# Patient Record
Sex: Female | Born: 1935 | Race: White | Hispanic: No
Health system: Midwestern US, Academic
[De-identification: ages and names within clinical notes are randomized; demographics above are authoritative.]

---

## 2020-02-28 ENCOUNTER — Encounter: Admit: 2020-02-28 | Discharge: 2020-02-28

## 2020-02-29 ENCOUNTER — Encounter: Admit: 2020-02-29 | Discharge: 2020-02-29

## 2020-03-22 ENCOUNTER — Ambulatory Visit: Admit: 2020-03-22 | Discharge: 2020-03-22 | Payer: MEDICARE

## 2020-03-22 ENCOUNTER — Encounter: Admit: 2020-03-22 | Discharge: 2020-03-22 | Payer: MEDICARE

## 2020-03-22 DIAGNOSIS — I272 Pulmonary hypertension, unspecified: Secondary | ICD-10-CM

## 2020-03-22 DIAGNOSIS — I10 Essential (primary) hypertension: Secondary | ICD-10-CM

## 2020-03-22 DIAGNOSIS — I509 Heart failure, unspecified: Secondary | ICD-10-CM

## 2020-03-22 DIAGNOSIS — I714 Abdominal aortic aneurysm, without rupture: Secondary | ICD-10-CM

## 2020-03-22 DIAGNOSIS — E785 Hyperlipidemia, unspecified: Secondary | ICD-10-CM

## 2020-03-22 DIAGNOSIS — E039 Hypothyroidism, unspecified: Secondary | ICD-10-CM

## 2020-03-22 MED ORDER — ASPIRIN 81 MG PO TBEC
81 mg | ORAL_TABLET | Freq: Every day | ORAL | 0 refills | Status: AC
Start: 2020-03-22 — End: ?

## 2020-03-22 NOTE — Progress Notes
Date of Service: 03/22/2020              Chief Complaint   Patient presents with   ? Other     AAA       History of Present Illness  I had the pleasure seeing this 86 year old female in the office for evaluation of an asymptomatic pararenal abdominal aortic aneurysm.  This has been monitored for the last year and a half and measured approximately 4.2 cm according to the patient when it was discovered.  There was some concern of growth on the most recent CT scan and she was referred for evaluation.  She otherwise is fairly active for her age and enjoys yard work and gardening.  She has significant scoliosis and certainly has issues with spinal stenosis and neuropathy.  She has osteoarthritis of her knees bilaterally.  She did state that her mother had an aneurysm but she died from Parkinson's disease.  She never needed any repair.  Patient has a distant history of smoking which she quit many years ago but when she was a smoker she smoked 1/2 pack/day for approximately 20 years.           Medical History:   Diagnosis Date   ? AAA (abdominal aortic aneurysm) (HCC)    ? Benign essential HTN    ? CHF (congestive heart failure) (HCC)    ? HLD (hyperlipidemia)    ? Hypothyroidism    ? Pulmonary HTN (HCC)        Surgical History:   Procedure Laterality Date   ? HYSTERECTOMY         Allergies:  Allergies   Allergen Reactions   ? Propranolol UNKNOWN       Medication List:  ? amLODIPine (NORVASC) 5 mg tablet Take 5 mg by mouth daily.   ? atorvastatin calcium (ATORVASTATIN PO) Take  by mouth.   ? CALCIUM PO Take  by mouth.   ? docosahexaenoic acid/epa (FISH OIL PO) Take  by mouth.   ? ergocalciferol (vitamin D2) (VITAMIN D PO) Take  by mouth.   ? Ferrous Fumarate 324 mg (106 mg iron) tab Take  by mouth.   ? furosemide (LASIX) 20 mg tablet Take 20 mg by mouth every morning.   ? HYDROcodone/acetaminophen (NORCO) 7.5/325 mg tablet Take 1 tablet by mouth every 6 hours as needed   ? levothyroxine (SYNTHROID) 200 mcg tablet Take 200 mcg by mouth daily 30 minutes before breakfast.   ? losartan (COZAAR) 50 mg tablet Take 50 mg by mouth daily.   ? methotrexate sodium 2.5 mg tablet Take 2.5 mg by mouth every 7 days.   ? nadoloL (CORGARD) 40 mg tablet Take 40 mg by mouth daily.   ? omeprazole DR (PRILOSEC) 20 mg capsule Take 20 mg by mouth daily before breakfast.   ? simvastatin (ZOCOR) 20 mg tablet Take 20 mg by mouth at bedtime daily.   ? zolpidem (AMBIEN) 10 mg tablet Take 10 mg by mouth at bedtime as needed.       Social History:   reports that she has quit smoking. She has never used smokeless tobacco. She reports previous alcohol use. She reports that she does not use drugs.    History reviewed. No pertinent family history.    Review of Systems   Constitutional: Negative for activity change, appetite change, chills, diaphoresis, fatigue, fever and unexpected weight change.   HENT: Negative for hearing loss, nosebleeds and tinnitus.    Eyes: Negative for  photophobia, itching and visual disturbance.   Respiratory: Negative for apnea, cough, chest tightness and shortness of breath.    Cardiovascular: Negative for chest pain, palpitations and leg swelling.   Gastrointestinal: Negative for abdominal pain, blood in stool, constipation, diarrhea, nausea and vomiting.   Endocrine: Negative for cold intolerance and heat intolerance.   Genitourinary: Negative for dysuria, frequency and hematuria.   Musculoskeletal: Negative for arthralgias, back pain, gait problem, joint swelling and myalgias.   Skin: Negative for color change, rash and wound.   Allergic/Immunologic: Negative for environmental allergies, food allergies and immunocompromised state.   Neurological: Negative for dizziness, tremors, seizures, syncope, facial asymmetry, speech difficulty, weakness, light-headedness and headaches.   Hematological: Negative for adenopathy. Does not bruise/bleed easily.   Psychiatric/Behavioral: Negative for behavioral problems, confusion and dysphoric mood. The patient is not nervous/anxious.    All other systems reviewed and are negative.              Vitals:    03/22/20 1443   BP: (!) 158/81   BP Source: Arm, Left Upper   Patient Position: Sitting   Pulse: 56   Weight: 78.9 kg (174 lb)   Height: 175.3 cm (69)     Body mass index is 25.7 kg/m?Marland Kitchen     Physical Exam  Constitutional:       Appearance: She is well-developed. She is not diaphoretic.   HENT:      Head: Normocephalic and atraumatic.   Eyes:      General:         Left eye: No discharge.      Conjunctiva/sclera: Conjunctivae normal.      Pupils: Pupils are equal, round, and reactive to light.   Neck:      Thyroid: No thyromegaly.      Vascular: No carotid bruit or JVD.      Trachea: No tracheal deviation.   Cardiovascular:      Rate and Rhythm: Normal rate and regular rhythm.      Pulses:           Carotid pulses are 2+ on the right side and 2+ on the left side.       Radial pulses are 2+ on the right side and 2+ on the left side.        Femoral pulses are 2+ on the right side and 2+ on the left side.       Dorsalis pedis pulses are detected w/ Doppler on the right side and detected w/ Doppler on the left side.        Posterior tibial pulses are detected w/ Doppler on the right side and detected w/ Doppler on the left side.      Heart sounds: Normal heart sounds. No murmur heard.   No friction rub. No gallop.    Pulmonary:      Effort: Pulmonary effort is normal. No respiratory distress.      Breath sounds: Normal breath sounds. No wheezing or rales.   Abdominal:      General: Bowel sounds are normal. There is no distension.      Palpations: Abdomen is soft. There is pulsatile mass. There is no mass.      Tenderness: There is no abdominal tenderness.   Musculoskeletal:         General: No tenderness or deformity. Normal range of motion.      Cervical back: Normal range of motion and neck supple.   Skin:     General:  Skin is warm and dry.      Findings: No erythema.   Neurological:      Mental Status: She is alert and oriented to person, place, and time.      Cranial Nerves: No cranial nerve deficit.   Psychiatric:         Behavior: Behavior normal.             Assessment and Plan:    Asymptomatic pararenal abdominal aortic aneurysm--I did review the patient's most recent CT scan of the abdomen and pelvis which does show a pararenal abdominal aortic aneurysm extending up to the level of the superior mesenteric artery.  It is involving the ostium of the right and left renal artery.  The maximum dimension that I can measure is approximately 4 cm in a orthogonal measurement.  I am not markedly concerned that there has been an great change in size.  I have recommended a repeat aortic duplex in 6 months with reevaluation.  If it is unchanged at that time, we will go back to annual testing.  All questions were answered to the patient's and her grandson satisfaction today.    Carlena Sax, DO, FACS, RPVI  Assistant Professor of Vascular Surgery  [place] [facility]

## 2020-03-22 NOTE — Progress Notes
86 y.o. female     New patient: AAA     Medications updated?  Yes       ? amLODIPine (NORVASC) 5 mg tablet Take 5 mg by mouth daily.   ? CALCIUM PO Take  by mouth.   ? docosahexaenoic acid/epa (FISH OIL PO) Take  by mouth.   ? ergocalciferol (vitamin D2) (VITAMIN D PO) Take  by mouth.   ? Ferrous Fumarate 324 mg (106 mg iron) tab Take  by mouth.   ? furosemide (LASIX) 20 mg tablet Take 20 mg by mouth every morning.   ? HYDROcodone/acetaminophen (NORCO) 7.5/325 mg tablet Take 1 tablet by mouth every 6 hours as needed   ? levothyroxine (SYNTHROID) 200 mcg tablet Take 200 mcg by mouth daily 30 minutes before breakfast.   ? losartan (COZAAR) 50 mg tablet Take 50 mg by mouth daily.   ? methotrexate sodium 2.5 mg tablet Take 2.5 mg by mouth every 7 days.   ? nadoloL (CORGARD) 40 mg tablet Take 40 mg by mouth daily.   ? omeprazole DR (PRILOSEC) 20 mg capsule Take 20 mg by mouth daily before breakfast.   ? simvastatin (ZOCOR) 20 mg tablet Take 20 mg by mouth at bedtime daily.   ? zolpidem (AMBIEN) 10 mg tablet Take 10 mg by mouth at bedtime as needed.       History updated?  Yes      has a past medical history of AAA (abdominal aortic aneurysm) (HCC), Benign essential HTN, CHF (congestive heart failure) (HCC), HLD (hyperlipidemia), Hypothyroidism, and Pulmonary HTN (HCC).      has a past surgical history that includes hysterectomy.     Allergies updated? Yes         Allergies   Allergen Reactions   ? Propranolol UNKNOWN

## 2021-10-26 ENCOUNTER — Encounter: Admit: 2021-10-26 | Discharge: 2021-10-26 | Payer: MEDICARE

## 2021-11-22 ENCOUNTER — Encounter: Admit: 2021-11-22 | Discharge: 2021-11-22 | Payer: MEDICARE

## 2021-11-30 ENCOUNTER — Encounter: Admit: 2021-11-30 | Discharge: 2021-11-30 | Payer: MEDICARE

## 2021-12-04 ENCOUNTER — Encounter: Admit: 2021-12-04 | Discharge: 2021-12-04 | Payer: MEDICARE

## 2021-12-05 ENCOUNTER — Ambulatory Visit: Admit: 2021-12-05 | Discharge: 2021-12-06 | Payer: MEDICARE

## 2021-12-05 ENCOUNTER — Encounter: Admit: 2021-12-05 | Discharge: 2021-12-05 | Payer: MEDICARE

## 2021-12-05 DIAGNOSIS — E039 Hypothyroidism, unspecified: Secondary | ICD-10-CM

## 2021-12-05 DIAGNOSIS — I714 AAA (abdominal aortic aneurysm) (HCC): Secondary | ICD-10-CM

## 2021-12-05 DIAGNOSIS — I719 Aortic aneurysm of unspecified site, without rupture: Secondary | ICD-10-CM

## 2021-12-05 DIAGNOSIS — I509 Heart failure, unspecified: Secondary | ICD-10-CM

## 2021-12-05 DIAGNOSIS — I10 Essential (primary) hypertension: Secondary | ICD-10-CM

## 2021-12-05 DIAGNOSIS — I272 Pulmonary hypertension, unspecified: Secondary | ICD-10-CM

## 2021-12-05 DIAGNOSIS — E785 Hyperlipidemia, unspecified: Secondary | ICD-10-CM

## 2021-12-05 NOTE — Progress Notes
Date of Service: 12/05/2021              Chief Complaint   Patient presents with   ? Follow Up     AAA       History of Present Illness  This a very pleasant 88 year old female who presents in follow-up for her pararenal abdominal aortic aneurysm.  I last saw her in 2021.  She had an updated aortic ultrasound performed at [facility] which showed her aneurysm had increased in size to 5.2 cm.  She is accompanied by her family at the bedside.  She has had recent issues with intention tremor and has been seeking treatment for this.  Otherwise she is at her baseline level of activity.      Medical History:   Diagnosis Date   ? AAA (abdominal aortic aneurysm) (HCC)    ? Benign essential HTN    ? CHF (congestive heart failure) (HCC)    ? HLD (hyperlipidemia)    ? Hypothyroidism    ? Pulmonary HTN (HCC)        Surgical History:   Procedure Laterality Date   ? HYSTERECTOMY         Allergies:  Allergies   Allergen Reactions   ? Propranolol UNKNOWN       Medication List:  ? amLODIPine (NORVASC) 5 mg tablet Take one tablet by mouth daily.   ? atorvastatin calcium (ATORVASTATIN PO) Take  by mouth.   ? CALCIUM PO Take  by mouth.   ? docosahexaenoic acid/epa (FISH OIL PO) Take  by mouth.   ? ergocalciferol (vitamin D2) (VITAMIN D PO) Take  by mouth.   ? Ferrous Fumarate 324 mg (106 mg iron) tab Take  by mouth.   ? furosemide (LASIX) 20 mg tablet Take one tablet by mouth every morning.   ? HYDROcodone/acetaminophen (NORCO) 7.5/325 mg tablet Take one tablet by mouth every 6 hours as needed.   ? levothyroxine (SYNTHROID) 200 mcg tablet Take one tablet by mouth daily 30 minutes before breakfast.   ? losartan (COZAAR) 50 mg tablet Take one tablet by mouth daily.   ? methotrexate sodium 2.5 mg tablet Take one tablet by mouth every 7 days.   ? nadoloL (CORGARD) 40 mg tablet Take one tablet by mouth daily.   ? omeprazole DR (PRILOSEC) 20 mg capsule Take one capsule by mouth daily before breakfast.   ? zolpidem (AMBIEN) 10 mg tablet Take one tablet by mouth at bedtime as needed.       Social History:   reports that she has quit smoking. She has never used smokeless tobacco. She reports that she does not currently use alcohol. She reports that she does not use drugs.    History reviewed. No pertinent family history.    Review of Systems            Vitals:    12/05/21 0905 12/05/21 0910   BP: (!) 149/81 (!) 143/84   BP Source: Arm, Right Upper Arm, Left Upper   Pulse: 55 64   Temp: 37 ?C (98.6 ?F)    TempSrc: Oral    PainSc: Seven    Weight: 75 kg (165 lb 6.4 oz)    Height: 167.6 cm (5' 6)      Body mass index is 26.7 kg/m?Marland Kitchen     Physical Exam  Constitutional:       Appearance: She is well-developed. She is not diaphoretic.   HENT:  Head: Normocephalic and atraumatic.   Eyes:      General:         Left eye: No discharge.      Conjunctiva/sclera: Conjunctivae normal.      Pupils: Pupils are equal, round, and reactive to light.   Neck:      Thyroid: No thyromegaly.      Vascular: No carotid bruit or JVD.      Trachea: No tracheal deviation.   Cardiovascular:      Rate and Rhythm: Normal rate and regular rhythm.      Pulses:           Carotid pulses are 2+ on the right side and 2+ on the left side.       Radial pulses are 2+ on the right side and 2+ on the left side.        Femoral pulses are 2+ on the right side and 2+ on the left side.       Popliteal pulses are 2+ on the right side and 2+ on the left side.        Dorsalis pedis pulses are 2+ on the right side and 2+ on the left side.        Posterior tibial pulses are 2+ on the right side and 2+ on the left side.      Heart sounds: Normal heart sounds. No murmur heard.     No friction rub. No gallop.   Pulmonary:      Effort: Pulmonary effort is normal. No respiratory distress.      Breath sounds: Normal breath sounds. No wheezing or rales.   Abdominal:      General: Bowel sounds are normal. There is no distension.      Palpations: Abdomen is soft. There is no mass. Tenderness: There is no abdominal tenderness.   Musculoskeletal:         General: No tenderness or deformity. Normal range of motion.      Cervical back: Normal range of motion and neck supple.   Skin:     General: Skin is warm and dry.      Findings: No erythema.   Neurological:      Mental Status: She is alert and oriented to person, place, and time.      Cranial Nerves: No cranial nerve deficit.   Psychiatric:         Behavior: Behavior normal.             Assessment and Plan:    Pararenal abdominal aortic aneurysm--I recommended a CT angiogram of the chest, abdomen, and pelvis for better imaging and decision making regarding her aneurysm.  We will also see if the aneurysm indeed has grown or if this is an ultrasound irregularity.  I discussed the natural history of aneurysm disease with the family and was able to show them multiple models of what is involved.  I will follow-up with the CT scan results and make further recommendations following review.    Carlena Sax, DO, FACS, RPVI  Assistant Professor of Vascular Surgery  [place] [facility]

## 2021-12-07 ENCOUNTER — Encounter: Admit: 2021-12-07 | Discharge: 2021-12-07 | Payer: MEDICARE

## 2021-12-07 NOTE — Telephone Encounter
Who is calling? Joanna Dunn    What is the patients need/concern? Patient called and stated that there was an order to be sent to [facility] to have her CT scan done.  Patient stated that she called several times to schedule the CT scan but was told that they will call her to schedule.  Patient stated that she still has not heard back from [facility].  Patient is wanting to know how can she get her scan scheduled.  Patient also stated that she is wanting to know where she will need to go to have here lab draw done that was placed for her.  Please reach back out to the patient to further discuss/assist.  Thank you.      Which hospital was the patient seen at? Not applicable     Are there any other needs/concerns the patient has?  no   If yes, description of concern:    Patient was notified that if call was received after 3:30 pm the call may not be returned until the next business day. Yes    Read back to patient before sending: yes    Call Back phone number: [phone]

## 2021-12-07 NOTE — Telephone Encounter
Patient is calling back stating [facility] advised they do not have orders for the CT scan. Patient is also asking if there is a place closer to home in [place], [place] that CT can be completed. Patient would also like to know where the lab is supposed to be completed and if it can be done at [facility] in [place], [place].   Patient asks that once we get the information, that we send a letter due to not being able to write things down clearly.

## 2021-12-07 NOTE — Telephone Encounter
Patient had requested that orders be sent closer to where she lives in [place].  I faxed orders to [facility] 12/07/21 @ 1525.  Called patient and informed her that orders were sent to [facility] and that they should be contacting her to schedule testing.  Patient stated that she understood and was very grateful.

## 2021-12-12 ENCOUNTER — Encounter: Admit: 2021-12-12 | Discharge: 2021-12-12 | Payer: MEDICARE

## 2021-12-12 DIAGNOSIS — I719 Aortic aneurysm of unspecified site, without rupture: Secondary | ICD-10-CM

## 2021-12-12 LAB — CREATININE: CREATININE: 1 mg (ref 0.57–1.11)

## 2021-12-13 ENCOUNTER — Encounter: Admit: 2021-12-13 | Discharge: 2021-12-13 | Payer: MEDICARE

## 2021-12-13 DIAGNOSIS — I719 Aortic aneurysm of unspecified site, without rupture: Secondary | ICD-10-CM

## 2021-12-19 ENCOUNTER — Encounter: Admit: 2021-12-19 | Discharge: 2021-12-19 | Payer: MEDICARE

## 2021-12-20 ENCOUNTER — Encounter: Admit: 2021-12-20 | Discharge: 2021-12-20 | Payer: MEDICARE

## 2021-12-20 DIAGNOSIS — I719 Aortic aneurysm of unspecified site, without rupture: Secondary | ICD-10-CM

## 2021-12-20 DIAGNOSIS — I7143 Infrarenal abdominal aortic aneurysm, without rupture (HCC): Secondary | ICD-10-CM

## 2022-07-23 ENCOUNTER — Encounter: Admit: 2022-07-23 | Discharge: 2022-07-23 | Payer: MEDICARE

## 2022-07-24 IMAGING — CR [ID]
2 series · 2 of 2 positions shown · non-contrast
Comparison: none

[chest pa]
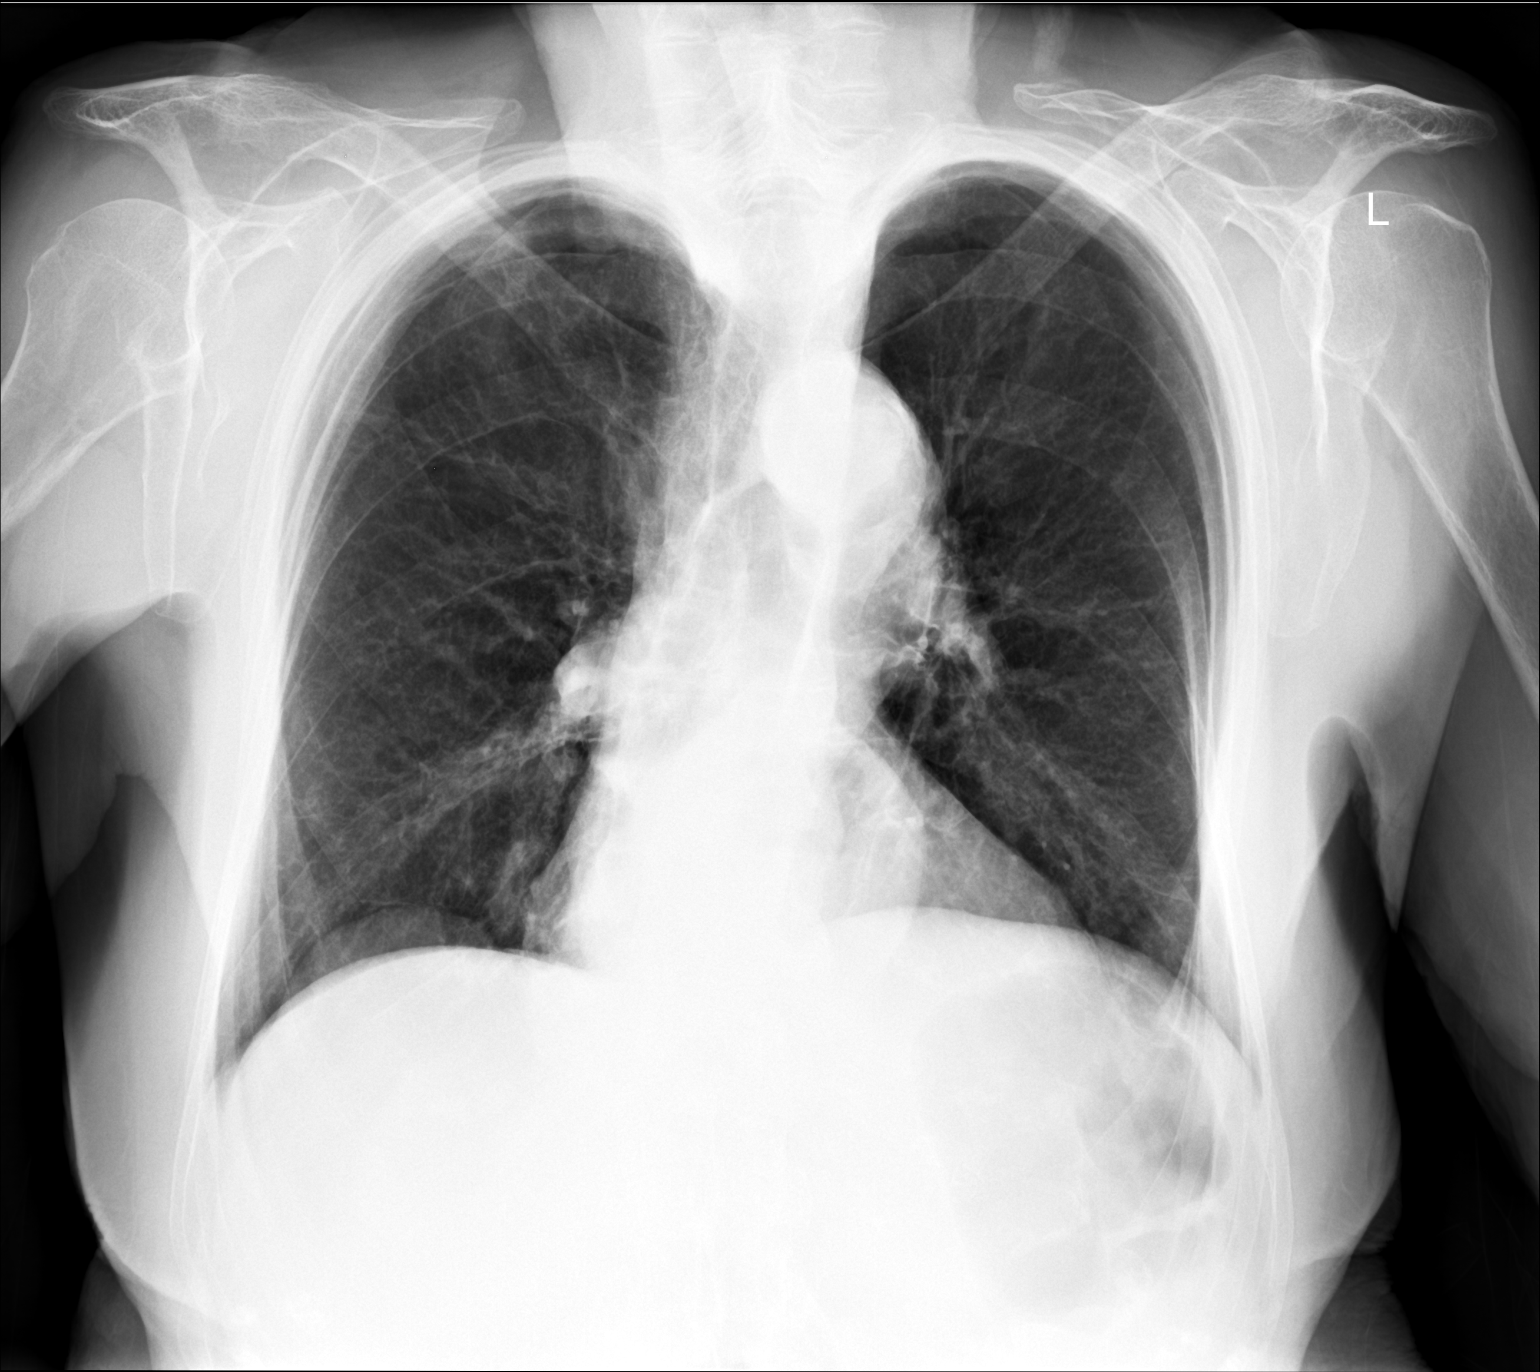

[chest lat]
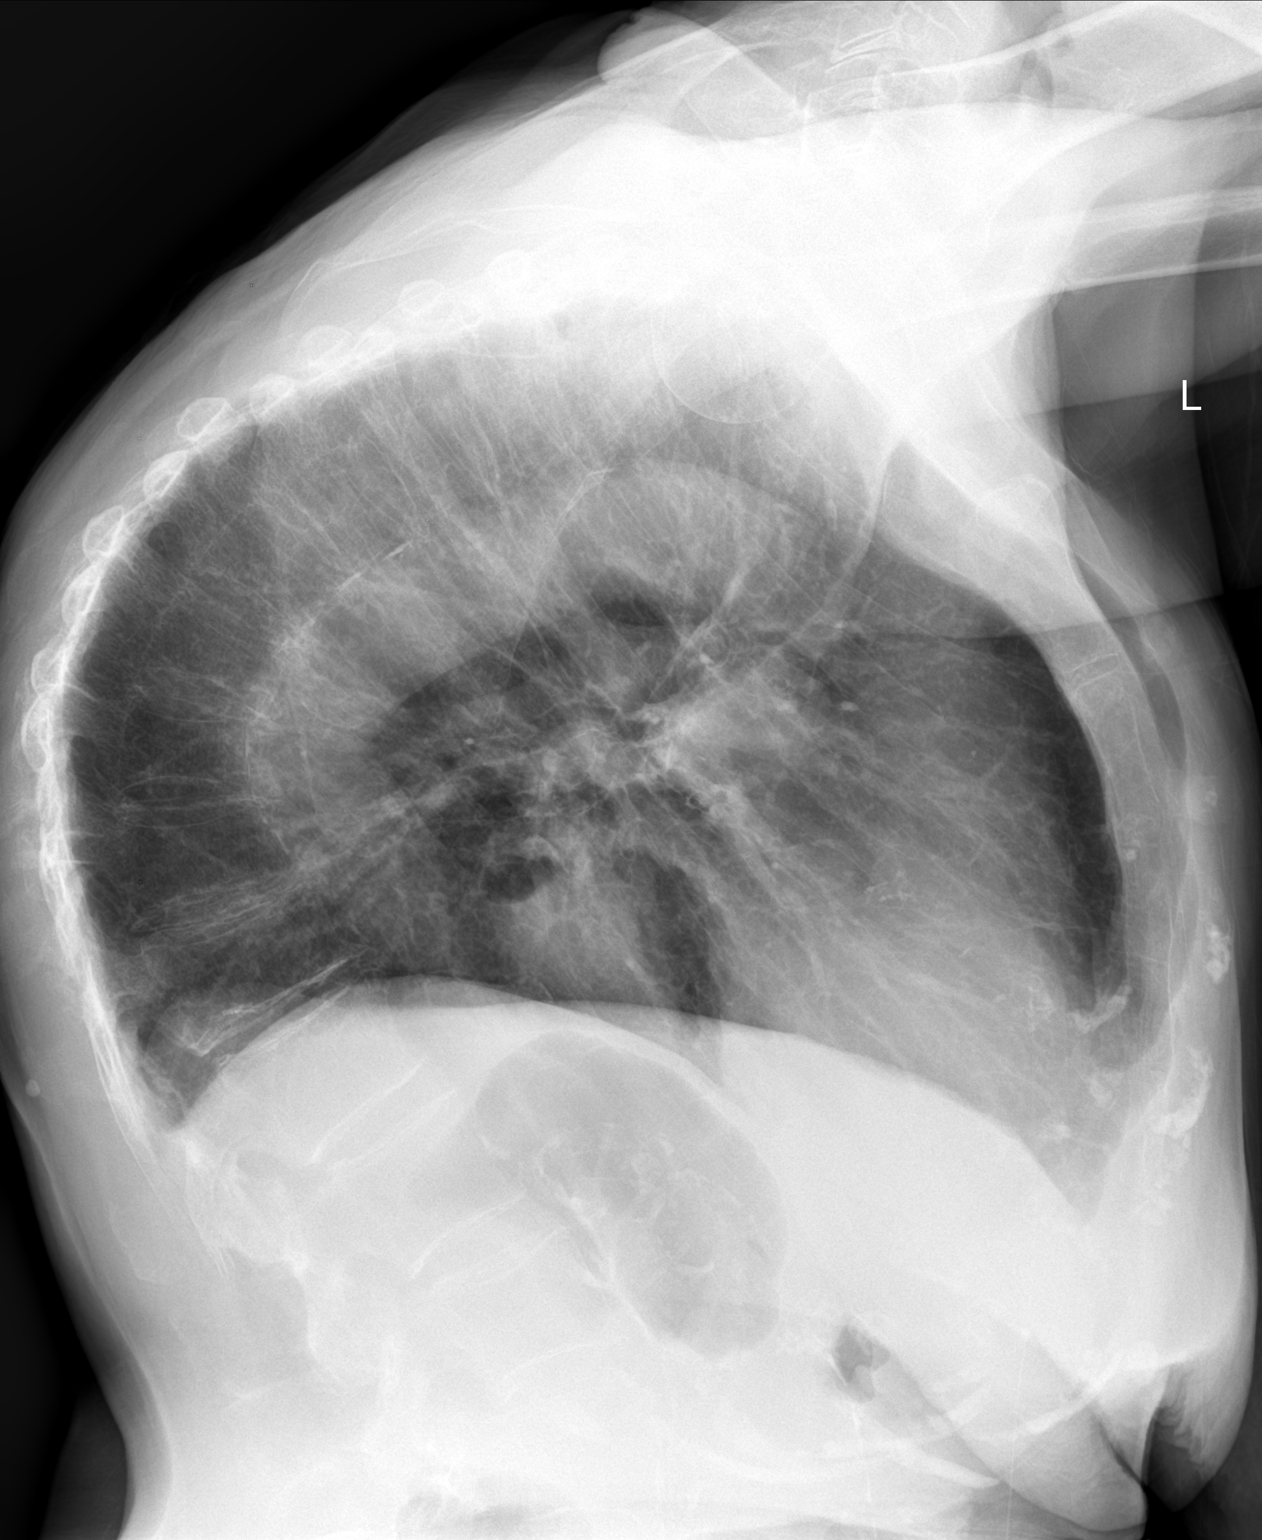

[2 of 2 positions shown; findings below may reference images not displayed]

EXAM

XR chest 2V

INDICATION

possible aspiration
Pt states had acid reflux x lastnight. Possible aspiration. CF

TECHNIQUE

PA and Lateral views of the chest

COMPARISONS

None available at the time of dictation.

FINDINGS

No radiographically apparent pleural effusion, consolidation, or pneumothorax. Mild biapical
subpleural fibrosis. Suspected emphysema.

The cardiomediastinal silhouette is normal in size. Mild prominence of the aortic knob.

The osseous structures are without an acute osseous abnormality. Severe thoracic kyphosis. Diffusely
decreased bone mineralization. Minimal anterior superior endplate height loss of lower thoracic
vertebra.

IMPRESSION
1. Emphysematous change.

Tech Notes:

Pt states had acid reflux x lastnight. Possible aspiration. CF

## 2022-07-30 ENCOUNTER — Encounter: Admit: 2022-07-30 | Discharge: 2022-07-30 | Payer: MEDICARE

## 2022-07-30 NOTE — Telephone Encounter
Patient is asking if ultrasounds can be performed closer to where she lives in [place] because of having no transportation for the day she is scheduled for on 08/06/2021. Patient also wants to know if appointment with Dr Julious Payer can be via telephone or if this can be test and report.

## 2022-07-30 NOTE — Telephone Encounter
Outbound call to patient using home/mobile number. Informed patient that the testing she is scheduled for needs to be completed in office. Patient agreeable, but asked to reschedule until at least March, 2024 due to transportation/weather. Patient rescheduled for 10/22/2022 starting at 8:30 am. Patient aware of fasting prep.

## 2022-10-02 ENCOUNTER — Encounter: Admit: 2022-10-02 | Discharge: 2022-10-02 | Payer: MEDICARE

## 2022-10-21 ENCOUNTER — Encounter: Admit: 2022-10-21 | Discharge: 2022-10-21 | Payer: MEDICARE

## 2022-10-22 ENCOUNTER — Encounter: Admit: 2022-10-22 | Discharge: 2022-10-22 | Payer: MEDICARE

## 2022-10-22 ENCOUNTER — Ambulatory Visit: Admit: 2022-10-22 | Discharge: 2022-10-22 | Payer: MEDICARE

## 2022-10-22 DIAGNOSIS — I272 Pulmonary hypertension, unspecified: Secondary | ICD-10-CM

## 2022-10-22 DIAGNOSIS — I719 Aortic aneurysm of unspecified site, without rupture: Secondary | ICD-10-CM

## 2022-10-22 DIAGNOSIS — E039 Hypothyroidism, unspecified: Secondary | ICD-10-CM

## 2022-10-22 DIAGNOSIS — E7849 Other hyperlipidemia: Secondary | ICD-10-CM

## 2022-10-22 DIAGNOSIS — I10 Essential (primary) hypertension: Secondary | ICD-10-CM

## 2022-10-22 DIAGNOSIS — E785 Hyperlipidemia, unspecified: Secondary | ICD-10-CM

## 2022-10-22 DIAGNOSIS — I7143 Infrarenal abdominal aortic aneurysm, without rupture (HCC): Secondary | ICD-10-CM

## 2022-10-22 DIAGNOSIS — I509 Heart failure, unspecified: Secondary | ICD-10-CM

## 2022-10-22 DIAGNOSIS — I7141 Pararenal abdominal aortic aneurysm (AAA) without rupture (HCC): Secondary | ICD-10-CM

## 2022-10-22 DIAGNOSIS — R0989 Other specified symptoms and signs involving the circulatory and respiratory systems: Secondary | ICD-10-CM

## 2022-10-22 DIAGNOSIS — N1831 Stage 3a chronic kidney disease (HCC): Secondary | ICD-10-CM

## 2022-10-22 DIAGNOSIS — I714 AAA (abdominal aortic aneurysm) (HCC): Secondary | ICD-10-CM

## 2022-10-22 LAB — POC CREATININE, RAD: CREATININE, POC: 1 mg/dL (ref 0.4–1.00)

## 2022-10-22 MED ORDER — SODIUM CHLORIDE 0.9 % IJ SOLN
50 mL | Freq: Once | INTRAVENOUS | 0 refills | Status: CP
Start: 2022-10-22 — End: ?
  Administered 2022-10-22: 15:00:00 50 mL via INTRAVENOUS

## 2022-10-22 MED ORDER — IOHEXOL 350 MG IODINE/ML IV SOLN
100 mL | Freq: Once | INTRAVENOUS | 0 refills | Status: CP
Start: 2022-10-22 — End: ?
  Administered 2022-10-22: 15:00:00 100 mL via INTRAVENOUS

## 2022-10-22 NOTE — Progress Notes
Date of Service: 10/22/2022              Chief Complaint   Patient presents with    Follow Up    Vascular Problem     Abdominal Aortic Aneurysm pararenal       History of Present Illness    This is a very sweet 89 year old female with a history of hypertension, hyperlipidemia, essential tremors and a pararenal abdominal aortic aneurysm.  She is back today for an aortoiliac ultrasound and ABIs.  She had a significant change in her aneurysm measurement and was added onto my schedule to discuss her results and recommendations for care.    She has a pararenal abdominal aortic aneurysm with a maximum aortic dimension today of 5.6 cm.  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  She has no iliac aneurysmal disease.  Her ABIs today were 1.29 on the right with a TBI of 0.90 and she had an ABI on the left of 1.43 with a TBI of 0.67.  She has triphasic arterial waveforms bilaterally.  Her last abdominal ultrasound showed a 5.2 cm pararenal abdominal aortic aneurysm.  This was followed by a CTA of the chest, abdomen and pelvis on Dec 12, 2021 at [facility] in [place].  That report suggested a 5.2 cm abdominal aortic aneurysm.  Dr. Phil Dopp reviewed those results and films and determined that the aneurysm was 4.8 cm.    She denies chest pain and shortness of breath.  She denies fever and chills.  She denies lateralizing TIA and strokelike symptoms.  She denies any new or different abdomen back or groin pain.  She has occasional abdominal pain associated with eating.  She has chronic mid back pain and a hiatal hernia which she relates contributes to occasional problems after eating certain foods.  She denies ischemic rest pain or tissue breakdown in either foot.  She denies symptoms of claudication when she walks.  She reports that in general, her legs just tend to feel weak.  She thinks this is because of arthritis in her knees.    She has no history of coronary artery intervention, bypass grafting, pacemaker or defibrillator implantation.  She does see cardiology regularly and her last echocardiogram showed a normal ejection fraction.  She has no history of stroke.  She has no history of radiation therapy or chemotherapy.    She is not diabetic.  Her most recent creatinine and GFR on record are 1.04 with a GFR of 53.  She does not smoke.    She takes low-dose aspirin and atorvastatin daily.        Medical History:   Diagnosis Date    AAA (abdominal aortic aneurysm) (HCC)     Benign essential HTN     CHF (congestive heart failure) (HCC)     HLD (hyperlipidemia)     Hypothyroidism     Pulmonary HTN (HCC)        Surgical History:   Procedure Laterality Date    HYSTERECTOMY         Allergies:  Allergies   Allergen Reactions    Propranolol UNKNOWN       Medication List:   amLODIPine (NORVASC) 5 mg tablet Take one tablet by mouth daily.    aspirin EC 81 mg tablet Take one tablet by mouth daily.    atorvastatin calcium (ATORVASTATIN PO) Take  by mouth.    CALCIUM PO Take  by mouth.    clonazePAM (KLONOPIN) 0.5  mg tablet Take 1 tablet every day by oral route at bedtime.    docosahexaenoic acid/epa (FISH OIL PO) Take  by mouth.    ergocalciferol (vitamin D2) (VITAMIN D PO) Take  by mouth.    Ferrous Fumarate 324 mg (106 mg iron) tab Take  by mouth.    furosemide (LASIX) 20 mg tablet Take one tablet by mouth every morning.    HYDROcodone/acetaminophen (NORCO) 7.5/325 mg tablet Take one tablet by mouth every 6 hours as needed.    levothyroxine (SYNTHROID) 200 mcg tablet Take one tablet by mouth daily 30 minutes before breakfast.    losartan (COZAAR) 50 mg tablet Take one tablet by mouth daily.    methotrexate sodium 2.5 mg tablet Take one tablet by mouth every 7 days.    nadoloL (CORGARD) 40 mg tablet Take one tablet by mouth daily.    omeprazole DR (PRILOSEC) 20 mg capsule Take one capsule by mouth daily before breakfast.    predniSONE (DELTASONE) 10 mg tablet Take one tablet by mouth daily.    zolpidem (AMBIEN) 10 mg tablet Take one tablet by mouth at bedtime as needed.       Social History:   reports that she has quit smoking. She has never used smokeless tobacco. She reports that she does not currently use alcohol. She reports that she does not use drugs.    History reviewed. No pertinent family history.    Review of Systems            Vitals:    10/22/22 0918 10/22/22 0919   BP: (!) 144/74 136/72   BP Source: Arm, Left Upper Arm, Right Upper   Pulse: 63 57   Temp: 36.3 ?C (97.4 ?F)    Resp: 14    TempSrc: Oral    PainSc: Eight      There is no height or weight on file to calculate BMI.     Physical Exam  Vitals and nursing note reviewed. Exam conducted with a chaperone present.   Constitutional:       General: She is not in acute distress.     Appearance: Normal appearance. She is well-developed, well-groomed and normal weight. She is not ill-appearing or diaphoretic.      Comments: She is accompanied by her son and daughter-in-law today.   HENT:      Head: Normocephalic.   Eyes:      General:         Right eye: No discharge.         Left eye: No discharge.   Neck:      Vascular: Carotid bruit present.      Trachea: Phonation normal. No tracheal tenderness.   Cardiovascular:      Rate and Rhythm: Normal rate and regular rhythm.      Pulses:           Carotid pulses are 2+ on the right side with bruit and 2+ on the left side with bruit.       Radial pulses are 2+ on the right side and 2+ on the left side.        Femoral pulses are 2+ on the right side and 2+ on the left side.       Popliteal pulses are 2+ on the right side and 2+ on the left side.        Dorsalis pedis pulses are 2+ on the right side and 2+ on the left side.  Posterior tibial pulses are detected w/ Doppler on the right side and detected w/ Doppler on the left side.      Heart sounds: Murmur heard.      Comments: He has easily palpable pulses throughout.  Pulmonary:      Effort: Pulmonary effort is normal. No respiratory distress.      Breath sounds: Normal breath sounds.   Abdominal:      General: Abdomen is protuberant.      Palpations: Abdomen is soft.      Tenderness: There is no abdominal tenderness.      Comments: Her bilateral groins are clean and dry without evidence of rash or intervention.   Musculoskeletal:         General: Normal range of motion.      Cervical back: Normal range of motion and neck supple.      Right lower leg: No edema.      Left lower leg: No edema.      Right foot: Normal range of motion. Bunion present. No deformity.      Left foot: Normal range of motion. Bunion present. No deformity.   Feet:      Right foot:      Skin integrity: Skin integrity normal. No ulcer, blister, skin breakdown or erythema.      Left foot:      Skin integrity: Skin integrity normal. No ulcer, blister, skin breakdown or erythema.   Skin:     General: Skin is warm and dry.      Capillary Refill: Capillary refill takes less than 2 seconds.      Coloration: Skin is not cyanotic or mottled.   Neurological:      General: No focal deficit present.      Mental Status: She is alert and oriented to person, place, and time.      Motor: Tremor present.      Gait: Gait is intact.      Comments: Her grip strength is strong and equal bilaterally.   Psychiatric:         Attention and Perception: Attention normal.         Mood and Affect: Mood normal.         Speech: Speech normal.         Behavior: Behavior normal. Behavior is cooperative.         Thought Content: Thought content normal.         Cognition and Memory: Cognition normal.         Judgment: Judgment normal.             Assessment and Plan:    1. Pararenal abdominal aortic aneurysm (AAA) without rupture (HCC)  CTA CHEST WO/W CONT    CTA ABD/PELVIS    CANCELED: CREATININE      2. Bilateral carotid bruits  VAS US DUPLEX SCAN CAROTID BILATERAL      3. Primary hypertension        4. Other hyperlipidemia        5. Stage 3a chronic kidney disease (HCC)          She has a 5.6 cm pararenal abdominal aortic aneurysm. This has grown by 0.4 cm since May 2023.  She has normal ABIs and peripheral pulses.  She has no signs or symptoms of ischemic rest pain or tissue breakdown in either foot.  She has had no new or different abdomen, back or groin pain.  I discussed with her her results  and recommendations for follow-up.  We discussed that if we are going to plan for any type of aneurysm repair, she will need a repeat CTA of the abdomen and pelvis.  Once she knows whether or not she could have a stent graft aneurysm repair with a fenestrated graft or if she would require an open abdominal aortic aneurysm repair, she can make a better decision about what she wants to do about the aneurysm.  We discussed that hypertension can affect rate of aneurysm growth.  That needs to be tempered with her being able to move around without dizziness and syncope.  We discussed that if her aneurysm should rupture, it is unlikely that she would survive it in good health.  If she would want to get it fixed at all, it is better to get it done in a controlled environment rather than in an emergency situation.  She is in relatively good shape for her age and has longevity in her family history.    She should continue her aspirin and atorvastatin daily.    We were able to work in a CTA of the chest, abdomen and pelvis today.    I encouraged her to follow-up with cardiology and just let them know that she is considering whether or not to have her aneurysm fixed and to make sure that there is no additional testing to include a stress test that cardiology would recommend prior to proceeding with an aneurysm repair.    She had bilateral carotid bruits and a heart murmur.  The carotid bruits may be related to the heart murmur but I am recommending a bilateral carotid surveillance ultrasound which she can have done with cardiology or closer to home.    Once we get all the results, she can follow-up by phone with Dr. Phil Dopp to discuss results and recommendations.    They had no additional questions or concerns for me today.    Kathaleen Maser, APRN-NP    >  54     Minutes spent on chart review, physical exam and documentation.

## 2022-10-28 ENCOUNTER — Encounter: Admit: 2022-10-28 | Discharge: 2022-10-28 | Payer: MEDICARE

## 2022-10-28 NOTE — Telephone Encounter
Patient would like a nurse to call her back with the results of scans performed on 4/9.  Please call patient at [phone]

## 2022-10-28 NOTE — Telephone Encounter
Error

## 2022-11-05 ENCOUNTER — Encounter: Admit: 2022-11-05 | Discharge: 2022-11-05 | Payer: MEDICARE

## 2022-11-05 ENCOUNTER — Ambulatory Visit: Admit: 2022-11-05 | Discharge: 2022-11-06 | Payer: MEDICARE

## 2022-11-05 DIAGNOSIS — I509 Heart failure, unspecified: Secondary | ICD-10-CM

## 2022-11-05 DIAGNOSIS — E785 Hyperlipidemia, unspecified: Secondary | ICD-10-CM

## 2022-11-05 DIAGNOSIS — E039 Hypothyroidism, unspecified: Secondary | ICD-10-CM

## 2022-11-05 DIAGNOSIS — I10 Essential (primary) hypertension: Secondary | ICD-10-CM

## 2022-11-05 DIAGNOSIS — I272 Pulmonary hypertension, unspecified: Secondary | ICD-10-CM

## 2022-11-05 DIAGNOSIS — I714 AAA (abdominal aortic aneurysm) (HCC): Secondary | ICD-10-CM

## 2022-11-05 DIAGNOSIS — I7141 Pararenal abdominal aortic aneurysm (AAA) without rupture (HCC): Secondary | ICD-10-CM

## 2022-11-05 NOTE — Progress Notes
Date of Service: 11/05/2022              Chief Complaint   Patient presents with    Follow Up     Pararenal AAA options       History of Present Illness  This a very pleasant 89 year old female who I had a telehealth visit today to discuss her asymptomatic infrarenal abdominal aortic aneurysm.  Patient has a pararenal abdominal aortic aneurysm which has demonstrated significant growth of almost 4 mm since the last interval measurement.  She is not a traditional endograft candidate.  She is not an open surgical candidate.  We had a long conversation regarding her options today.  Some of her family members were also able to participate in the call.  I did review her most recent stress testing and workup.  She has carotid testing coming up.      Medical History:   Diagnosis Date    AAA (abdominal aortic aneurysm) (HCC)     Benign essential HTN     CHF (congestive heart failure) (HCC)     HLD (hyperlipidemia)     Hypothyroidism     Pulmonary HTN (HCC)        Surgical History:   Procedure Laterality Date    HYSTERECTOMY         Allergies:  Allergies   Allergen Reactions    Topiramate NAUSEA ONLY    Propranolol UNKNOWN       Medication List:   amLODIPine (NORVASC) 5 mg tablet Take one tablet by mouth daily.    aspirin EC 81 mg tablet Take one tablet by mouth daily.    atorvastatin calcium (ATORVASTATIN PO) Take  by mouth.    calcium carbonate (CALTRATE 600) 600 mg calcium (1,500 mg) tablet Take one tablet by mouth daily.    CALCIUM PO Take  by mouth.    docosahexaenoic acid/epa (FISH OIL PO) Take  by mouth.    ergocalciferol (vitamin D2) (VITAMIN D PO) Take  by mouth.    Ferrous Fumarate 324 mg (106 mg iron) tab Take  by mouth.    folic acid (FOLVITE) 1 mg tablet Take one tablet by mouth daily.    furosemide (LASIX) 20 mg tablet Take one tablet by mouth every morning.    HYDROcodone/acetaminophen (NORCO) 7.5/325 mg tablet Take one tablet by mouth every 6 hours as needed.    levothyroxine (SYNTHROID) 200 mcg tablet Take one tablet by mouth daily 30 minutes before breakfast.    LORazepam (ATIVAN) 2 mg tablet Take one tablet by mouth twice daily as needed.    losartan (COZAAR) 50 mg tablet Take one tablet by mouth daily.    meloxicam (MOBIC) 7.5 mg tablet Take one tablet by mouth daily.    methotrexate sodium 2.5 mg tablet Take one tablet by mouth every 7 days.    nadoloL (CORGARD) 40 mg tablet Take one tablet by mouth daily.    omeprazole DR (PRILOSEC) 20 mg capsule Take one capsule by mouth daily before breakfast.    oxybutynin XL (DITROPAN XL) 5 mg tablet Take one tablet by mouth daily.    predniSONE (DELTASONE) 10 mg tablet Take one tablet by mouth daily.    zolpidem (AMBIEN) 10 mg tablet Take one tablet by mouth at bedtime as needed.       Social History:   reports that she has quit smoking. She has never used smokeless tobacco. She reports that she does not currently use alcohol. She reports that she does  not use drugs.    History reviewed. No pertinent family history.    Review of Systems            Vitals:    11/05/22 1008   BP: 117/84  Comment: Vitals per pt   BP Source: Arm, Left Upper   Pulse: 81   PainSc: Zero   Weight: 78.5 kg (173 lb)   Height: 167.6 cm (5' 6)     Body mass index is 27.92 kg/m?Marland Kitchen     Physical Exam  Not performed due to telehealth visit      Assessment and Plan:    Asymptomatic pararenal abdominal aortic aneurysm--patient has a pararenal abdominal aortic aneurysm which has grown approximately 4 mm in a short interval.  After discussion with my partner Dr. Marthann Schiller, the patient would be a physician modified endograft candidate.  She is not a TAMBE candidate.  I long conversation with the patient regarding our options which include complex endovascular repair versus no repair at all.  Patient understands that no repair would eventually lead to aneurysm rupture in the future.  I discussed the potential complications of endovascular repair including but not limited to renal failure resulting in dialysis, spinal cord ischemia resulting in paralysis, cardiovascular complications, stroke, and death.  I allowed the patient and her family to ask all questions and answered their concerns.  They are going to discuss these options amongst themselves and let us know how they wish to proceed.  In addition I offered to arrange referral to high-volume center in [place] or at the [facility].  They would rather not travel if possible.  Patient will let [place] know how she wishes to proceed and we will make further arrangements.  All questions were answered to their satisfaction today.      Total time 45 minutes.  Estimated counseling time 45 minutes.  Counseled patient and her family regarding aneurysm as discussed above.     Carlena Sax, DO, FACS, RPVI  Assistant Professor of Vascular Surgery  [employer] [facility]

## 2022-11-14 ENCOUNTER — Encounter: Admit: 2022-11-14 | Discharge: 2022-11-14 | Payer: MEDICARE

## 2022-11-14 DIAGNOSIS — R0989 Other specified symptoms and signs involving the circulatory and respiratory systems: Secondary | ICD-10-CM

## 2022-11-19 ENCOUNTER — Encounter: Admit: 2022-11-19 | Discharge: 2022-11-19 | Payer: MEDICARE

## 2022-11-20 ENCOUNTER — Encounter: Admit: 2022-11-20 | Discharge: 2022-11-20 | Payer: MEDICARE

## 2022-11-20 DIAGNOSIS — I509 Heart failure, unspecified: Secondary | ICD-10-CM

## 2022-11-20 DIAGNOSIS — E785 Hyperlipidemia, unspecified: Secondary | ICD-10-CM

## 2022-11-20 DIAGNOSIS — I714 AAA (abdominal aortic aneurysm) (HCC): Secondary | ICD-10-CM

## 2022-11-20 DIAGNOSIS — I10 Essential (primary) hypertension: Secondary | ICD-10-CM

## 2022-11-20 DIAGNOSIS — E039 Hypothyroidism, unspecified: Secondary | ICD-10-CM

## 2022-11-20 DIAGNOSIS — I272 Pulmonary hypertension, unspecified: Secondary | ICD-10-CM

## 2022-12-04 ENCOUNTER — Encounter: Admit: 2022-12-04 | Discharge: 2022-12-04 | Payer: MEDICARE

## 2022-12-04 NOTE — Telephone Encounter
Jenny(LPN) from [facility] needing to know the name of the blood pressure medicine that vascular surgeon has prescribed for patient.  Please call Boneta Lucks back at [phone]

## 2023-02-20 ENCOUNTER — Encounter: Admit: 2023-02-20 | Discharge: 2023-02-20 | Payer: MEDICARE

## 2023-02-20 NOTE — Telephone Encounter
Patient due in November, 2024 for CTA and office visit with Dr Marthann Schiller for pararenal AAA follow up. Outbound call to patient using home/mobile number. LVM requesting callback to assist with scheduling. Unsure if patient does CTA locally or through [place].

## 2023-02-20 NOTE — Telephone Encounter
Patient has an appointment request for CT scan. Please call patient to schedule and link orders.

## 2023-02-21 ENCOUNTER — Encounter: Admit: 2023-02-21 | Discharge: 2023-02-21 | Payer: MEDICARE

## 2023-03-24 ENCOUNTER — Encounter: Admit: 2023-03-24 | Discharge: 2023-03-24 | Payer: MEDICARE

## 2023-04-15 ENCOUNTER — Encounter: Admit: 2023-04-15 | Discharge: 2023-04-15 | Payer: MEDICARE

## 2023-04-23 ENCOUNTER — Encounter: Admit: 2023-04-23 | Discharge: 2023-04-23 | Payer: MEDICARE

## 2023-04-23 NOTE — Telephone Encounter
Duplicate. See 08/08 telephone encounter

## 2023-04-23 NOTE — Telephone Encounter
Patient has an appointment request for CT scan. Please call patient to schedule and link orders.

## 2023-05-15 ENCOUNTER — Encounter: Admit: 2023-05-15 | Discharge: 2023-05-15 | Payer: MEDICARE

## 2023-05-15 ENCOUNTER — Encounter: Admit: 2023-05-15 | Discharge: 2023-05-16 | Payer: MEDICARE

## 2023-05-15 DIAGNOSIS — I7141 Pararenal abdominal aortic aneurysm (AAA) without rupture (HCC): Secondary | ICD-10-CM

## 2023-05-15 NOTE — Telephone Encounter
Dr. Rubye Oaks radiologist reading the CTA abd/pelvis which was completed at [facility] today called to report a noted increase in her abdominal aortic aneurysm from 5.6 to 6.5 cm. I contacted [facility] and requested the report be faxed to our office and images clouded to [place] for review. She has a follow up appointment in our clinic on 05/28/2023 with Dr. Marthann Schiller.

## 2023-05-16 ENCOUNTER — Encounter: Admit: 2023-05-16 | Discharge: 2023-05-16 | Payer: MEDICARE

## 2023-05-26 ENCOUNTER — Encounter: Admit: 2023-05-26 | Discharge: 2023-05-26 | Payer: MEDICARE

## 2023-05-27 ENCOUNTER — Encounter: Admit: 2023-05-27 | Discharge: 2023-05-27 | Payer: MEDICARE

## 2023-05-28 ENCOUNTER — Encounter: Admit: 2023-05-28 | Discharge: 2023-05-28 | Payer: MEDICARE

## 2023-05-28 ENCOUNTER — Ambulatory Visit: Admit: 2023-05-28 | Discharge: 2023-05-29 | Payer: MEDICARE

## 2023-06-06 ENCOUNTER — Encounter: Admit: 2023-06-06 | Discharge: 2023-06-06 | Payer: MEDICARE

## 2023-08-28 ENCOUNTER — Encounter: Admit: 2023-08-28 | Discharge: 2023-08-28 | Payer: MEDICARE

## 2023-08-28 NOTE — Telephone Encounter
08/28/2023 2:44 PM- Patient has an appointment request for CT scan. Please call patient to schedule and link orders.

## 2023-09-01 ENCOUNTER — Encounter: Admit: 2023-09-01 | Discharge: 2023-09-01 | Payer: MEDICARE

## 2023-09-01 DIAGNOSIS — I7141 Pararenal abdominal aortic aneurysm (AAA) without rupture (HCC): Secondary | ICD-10-CM

## 2023-10-09 ENCOUNTER — Encounter: Admit: 2023-10-09 | Discharge: 2023-10-09 | Payer: MEDICARE

## 2023-10-20 ENCOUNTER — Encounter: Admit: 2023-10-20 | Discharge: 2023-10-20

## 2023-10-20 DIAGNOSIS — I7143 Infrarenal abdominal aortic aneurysm, without rupture: Secondary | ICD-10-CM

## 2023-10-20 DIAGNOSIS — I7141 Pararenal abdominal aortic aneurysm (AAA) without rupture: Secondary | ICD-10-CM

## 2023-11-10 ENCOUNTER — Encounter: Admit: 2023-11-10 | Discharge: 2023-11-10 | Payer: MEDICARE

## 2023-11-26 ENCOUNTER — Encounter: Admit: 2023-11-26 | Discharge: 2023-11-26 | Payer: MEDICARE

## 2023-12-01 ENCOUNTER — Encounter: Admit: 2023-12-01 | Discharge: 2023-12-01 | Payer: MEDICARE

## 2023-12-06 ENCOUNTER — Encounter: Admit: 2023-12-06 | Discharge: 2023-12-06 | Payer: MEDICARE

## 2023-12-16 ENCOUNTER — Encounter: Admit: 2023-12-16 | Discharge: 2023-12-16 | Payer: MEDICARE

## 2023-12-17 ENCOUNTER — Ambulatory Visit: Admit: 2023-12-17 | Discharge: 2023-12-18 | Payer: MEDICARE

## 2023-12-17 ENCOUNTER — Encounter: Admit: 2023-12-17 | Discharge: 2023-12-17 | Payer: MEDICARE

## 2023-12-17 DIAGNOSIS — I7161 Supraceliac abdominal aortic aneurysm (AAA) without rupture: Secondary | ICD-10-CM

## 2023-12-17 NOTE — Patient Instructions
 Surgical intervention is not recommended by Dr. Auston Left.    No further imaging needed.

## 2023-12-17 NOTE — Progress Notes
 Date of Service: 12/17/2023              Chief Complaint   Patient presents with    Follow Up       History of Present Illness    89 year old woman previously seen by my partner Dr. Maybell Spates    She has an extent 4 thoracoabdominal aneurysm  She is 89 years old with congestive heart failure hypertension and pulmonary hypertension.    She is not a candidate for open repair.    On review of her recent CT scan there has been some growth in the aneurysm up to about 5.5 to 5.8 cm.    As previously noted in Dr. 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    If we were to repair this would require sacrificing her left kidney and would still be a prolonged operation with a very high chance of having to go to a nursing home or long-term LTAC afterwards if she survived.    I think there is a very high chance she would about dialysis giving her small left renal artery that have to be sacrificed in her right renal artery which is tortuous with atherosclerotic disease.      I had a long discussion over the telephone/telehealth with her and her family today and I do not think I would offer repair given her age and the complexity of the aneurysm.    She agrees and tells me she has decided she would not want surgery for this.    We have agreed that we will not offer her repair, if this ruptured we would not transfer her here to [place] would not offer urgent repair.  She would need to be made comfort care at her local facility.    This was a long discussion and took over 15 minutes including discussion and my independent review of her images and previous notes.        Past Medical History:    AAA (abdominal aortic aneurysm)    Benign essential HTN    CHF (congestive heart failure) (CMS-HCC)    HLD (hyperlipidemia)    Hypothyroidism    Pulmonary HTN (CMS-HCC)       Surgical History:   Procedure Laterality Date    HYSTERECTOMY Allergies:  Allergies   Allergen Reactions    Topiramate NAUSEA ONLY    Propranolol UNKNOWN       Medication List:   amLODIPine (NORVASC) 5 mg tablet Take one tablet by mouth daily.    aspirin EC 81 mg tablet Take one tablet by mouth daily.    atorvastatin calcium (ATORVASTATIN PO) Take  by mouth.    calcium carbonate (CALTRATE 600) 600 mg calcium (1,500 mg) tablet Take one tablet by mouth daily.    CALCIUM PO Take  by mouth.    ergocalciferol (vitamin D2) (VITAMIN D PO) Take  by mouth.    Ferrous Fumarate 324 mg (106 mg iron) tab Take  by mouth.    furosemide (LASIX) 20 mg tablet Take one tablet by mouth every morning. (Patient taking differently: Take one tablet by mouth daily as needed.)    hydrALAZINE (APRESOLINE) 25 mg tablet TAKE ONE TABLET BY MOUTH EVERY MORNING and TAKE ONE TABLET EVERY DAY AT NOON and TAKE ONE TABLET EVERY NIGHT AT BEDTIME    HYDROcodone/acetaminophen (NORCO) 7.5/325 mg tablet Take one tablet by mouth every 6 hours as  needed.    levothyroxine (SYNTHROID) 200 mcg tablet Take one tablet by mouth daily 30 minutes before breakfast.    LORazepam (ATIVAN) 2 mg tablet Take one tablet by mouth twice daily as needed.    losartan (COZAAR) 50 mg tablet Take one tablet by mouth daily.    meloxicam (MOBIC) 7.5 mg tablet Take one tablet by mouth daily.    methotrexate sodium 2.5 mg tablet Take one tablet by mouth every 7 days. (Patient not taking: Reported on 12/17/2023)    minoxidiL (LONITEN) 2.5 mg tablet Take one tablet by mouth daily.    nadoloL (CORGARD) 40 mg tablet Take one tablet by mouth daily.    nitrofurantoin macrocrystaL (MACRODANTIN) 50 mg capsule Take one capsule by mouth daily.    omeprazole DR (PRILOSEC) 20 mg capsule Take one capsule by mouth daily before breakfast.    oxybutynin XL (DITROPAN XL) 5 mg tablet Take one tablet by mouth daily.    predniSONE (DELTASONE) 10 mg tablet Take one tablet by mouth daily.    primidone (MYSOLINE) 50 mg tablet Take one-half tablet by mouth daily. spironolactone (ALDACTONE) 50 mg tablet Take one tablet by mouth daily.    zolpidem (AMBIEN) 10 mg tablet Take one tablet by mouth at bedtime as needed.       Social History:   reports that she has quit smoking. She has never used smokeless tobacco. She reports that she does not currently use alcohol. She reports that she does not use drugs.    No family history on file.    Review of Systems            Vitals:    12/17/23 1016   BP: 136/84   BP Source: Arm, Left Upper   Pulse: 56   PainSc: Two   Weight: 74.8 kg (165 lb)   Height: 167.6 cm (5' 6)     Body mass index is 26.63 kg/m?Joanna Dunn     Physical Exam        Assessment and Plan:    No diagnosis found.

## 2024-07-15 DEATH — deceased
